# Patient Record
Sex: Male | Born: 1975 | Race: Black or African American | Hispanic: No | Marital: Single | Smoking: Former smoker
Health system: Southern US, Community
[De-identification: ages and names within clinical notes are randomized; demographics above are authoritative.]

## PROBLEM LIST (undated history)

## (undated) DIAGNOSIS — S83519A Sprain of anterior cruciate ligament of unspecified knee, initial encounter: Secondary | ICD-10-CM

---

## 2013-01-27 ENCOUNTER — Emergency Department: Payer: Self-pay | Admitting: Emergency Medicine

## 2013-07-02 ENCOUNTER — Emergency Department: Payer: Self-pay | Admitting: Emergency Medicine

## 2014-03-14 ENCOUNTER — Emergency Department: Payer: Self-pay | Admitting: Internal Medicine

## 2014-03-31 ENCOUNTER — Emergency Department: Payer: Self-pay | Admitting: Emergency Medicine

## 2014-05-06 ENCOUNTER — Emergency Department: Payer: Self-pay | Admitting: Emergency Medicine

## 2014-05-29 ENCOUNTER — Emergency Department: Payer: Self-pay | Admitting: Internal Medicine

## 2014-10-10 ENCOUNTER — Emergency Department: Payer: Self-pay | Admitting: Student

## 2014-11-03 ENCOUNTER — Emergency Department: Payer: Self-pay | Admitting: Emergency Medicine

## 2014-12-04 ENCOUNTER — Emergency Department: Payer: Self-pay | Admitting: Emergency Medicine

## 2014-12-26 ENCOUNTER — Emergency Department: Payer: Self-pay | Admitting: Emergency Medicine

## 2015-03-13 ENCOUNTER — Emergency Department: Payer: Self-pay | Admitting: Emergency Medicine

## 2015-04-25 ENCOUNTER — Emergency Department
Admission: EM | Admit: 2015-04-25 | Discharge: 2015-04-25 | Disposition: A | Discharge status: Home / Self Care | Payer: Self-pay | Attending: Student | Admitting: Student

## 2015-04-25 ENCOUNTER — Encounter: Payer: Self-pay | Admitting: *Deleted

## 2015-04-25 DIAGNOSIS — Y9289 Other specified places as the place of occurrence of the external cause: Secondary | ICD-10-CM | POA: Insufficient documentation

## 2015-04-25 DIAGNOSIS — W1849XA Other slipping, tripping and stumbling without falling, initial encounter: Secondary | ICD-10-CM | POA: Insufficient documentation

## 2015-04-25 DIAGNOSIS — G8929 Other chronic pain: Secondary | ICD-10-CM | POA: Insufficient documentation

## 2015-04-25 DIAGNOSIS — S8992XA Unspecified injury of left lower leg, initial encounter: Secondary | ICD-10-CM | POA: Insufficient documentation

## 2015-04-25 DIAGNOSIS — Y9301 Activity, walking, marching and hiking: Secondary | ICD-10-CM | POA: Insufficient documentation

## 2015-04-25 DIAGNOSIS — Y990 Civilian activity done for income or pay: Secondary | ICD-10-CM | POA: Insufficient documentation

## 2015-04-25 DIAGNOSIS — M25562 Pain in left knee: Secondary | ICD-10-CM

## 2015-04-25 DIAGNOSIS — Z720 Tobacco use: Secondary | ICD-10-CM | POA: Insufficient documentation

## 2015-04-25 MED ORDER — OXYCODONE-ACETAMINOPHEN 5-325 MG PO TABS: 1.0000 | ORAL_TABLET | Freq: Four times a day (QID) | ORAL | Status: DC | PRN

## 2015-04-25 NOTE — ED Notes (Signed)
Pt has left knee pain.   Pt wearing a brace on left knee.  Pt states he twisted knee at work today.  States not WC

## 2015-04-25 NOTE — ED Provider Notes (Signed)
[facility] Emergency Department Provider Note ____________________________________________  Time seen: ----------------------------------------- 9:08 PM on 04/25/2015 -----------------------------------------    I have reviewed the triage vital signs and the nursing notes.   HISTORY  Chief Complaint Knee Pain    HPI Isaac Nguyen is a 39 y.o. male with hx of meniscal tear, ACL tear and tendonitis of the left knee.  [facility] [facility] [place] [facility].  Reports slipping on stairs while not wearing his knee brace and had acute severe knee pain.   Pain is general but worse on medial aspect.  No redness.  Minimal swelling.  No past medical history on file.  There are no active problems to display for this patient.   No past surgical history on file.  Current Outpatient Rx  Name  Route  Sig  Dispense  Refill  . oxyCODONE-acetaminophen (ROXICET) 5-325 MG per tablet   Oral   Take 1 tablet by mouth every 6 (six) hours as needed.   20 tablet   0     Allergies Review of patient's allergies indicates no known allergies.  No family history on file.  Social History History  Substance Use Topics  . Smoking status: Current Every Day Smoker  . Smokeless tobacco: Not on file  . Alcohol Use: No    Review of Systems  Constitutional: Negative for fever. Cardiovascular: Negative for chest pain. Respiratory: Negative for shortness of breath.      ____________________________________________   PHYSICAL EXAM:  VITAL SIGNS: ED Triage Vitals  Enc Vitals Group     BP 04/25/15 2002 138/80 mmHg     Pulse Rate 04/25/15 2002 77     Resp 04/25/15 2002 18     Temp 04/25/15 2002 98.6 F (37 C)     Temp Source 04/25/15 2002 Oral     SpO2 04/25/15 2002 99 %     Weight 04/25/15 2002 306 lb (138.801 kg)     Height 04/25/15 2002 6\' 3"  (1.905 m)     Head Cir --      Peak Flow --      Pain Score 04/25/15 2004 7     Pain Loc --       Pain Edu? --      Excl. in GC? --     Constitutional: Alert and oriented. Well appearing and in no distress. Eyes: Conjunctivae are normal. PERRL. Normal extraocular movements. Cardiovascular: Normal rate, regular rhythm. Normal and symmetric distal pulses are present in all extremities. No murmurs, rubs, or gallops. Respiratory: Normal respiratory effort without tachypnea nor retractions. Breath sounds are clear and equal bilaterally. No wheezes/rales/rhonchi. Musculoskeletal: Nontender with normal range of motion in all extremities, except knee, left.  Tender over joint line medial/lateral, w/o effusion.  Pain with valgus/varus stress. Minimal laxity on lachmans. Neurologic:  Normal speech and language. No gross focal neurologic deficits are appreciated. Speech is normal.  Skin:  Skin is warm, dry and intact. No rash noted. Psychiatric: Mood and affect are normal. Speech and behavior are normal. Patient exhibits appropriate insight and judgment.  ____________________________________________    LABS (pertinent positives/negatives)    ____________________________________________   EKG    ____________________________________________    RADIOLOGY    ____________________________________________   PROCEDURES  Procedure(s) performed: None  Critical Care performed: No  ____________________________________________   INITIAL IMPRESSION / ASSESSMENT AND PLAN / ED COURSE  Left knee pain  Acute on chronic knee pain with hx of meniscal injury, ACL tear.  Given percocet #20, for acute injury and encouraged follow up with his orthopedist for further evaluation.  Pertinent labs & imaging results that were available during my care of the patient were reviewed by me and considered in my medical decision making (see chart for details).  ____________________________________________   FINAL CLINICAL IMPRESSION(S) / ED DIAGNOSES  Final diagnoses:  Left knee pain       Ignacia BayleyRobert Shekia Kuper, PA-C 04/25/15 2130  Gayla DossEryka A Gayle, MD 04/26/15 [phone]

## 2015-04-25 NOTE — ED Notes (Signed)
Pt. Here with own supportive support lt. Knee brace.

## 2015-04-25 NOTE — ED Notes (Signed)
Pt. States he was not wearing lower leg support today while walking down stairs, pt. States he slipped on floor not sure how leg turned.  Pt. Was able to catch himself.

## 2015-05-20 ENCOUNTER — Emergency Department
Admission: EM | Admit: 2015-05-20 | Discharge: 2015-05-20 | Discharge status: Against Medical Advice | Payer: Self-pay | Attending: Emergency Medicine | Admitting: Emergency Medicine

## 2015-05-20 DIAGNOSIS — Y9389 Activity, other specified: Secondary | ICD-10-CM | POA: Insufficient documentation

## 2015-05-20 DIAGNOSIS — Y9289 Other specified places as the place of occurrence of the external cause: Secondary | ICD-10-CM | POA: Insufficient documentation

## 2015-05-20 DIAGNOSIS — Z720 Tobacco use: Secondary | ICD-10-CM | POA: Insufficient documentation

## 2015-05-20 DIAGNOSIS — Y998 Other external cause status: Secondary | ICD-10-CM | POA: Insufficient documentation

## 2015-05-20 DIAGNOSIS — T148 Other injury of unspecified body region: Secondary | ICD-10-CM | POA: Insufficient documentation

## 2015-05-20 DIAGNOSIS — W540XXA Bitten by dog, initial encounter: Secondary | ICD-10-CM | POA: Insufficient documentation

## 2015-08-07 ENCOUNTER — Encounter: Payer: Self-pay | Admitting: Emergency Medicine

## 2015-08-07 ENCOUNTER — Emergency Department: Payer: Self-pay

## 2015-08-07 ENCOUNTER — Emergency Department
Admission: EM | Admit: 2015-08-07 | Discharge: 2015-08-07 | Disposition: A | Discharge status: Home / Self Care | Payer: Self-pay | Attending: Emergency Medicine | Admitting: Emergency Medicine

## 2015-08-07 DIAGNOSIS — Y9389 Activity, other specified: Secondary | ICD-10-CM | POA: Insufficient documentation

## 2015-08-07 DIAGNOSIS — W1843XA Slipping, tripping and stumbling without falling due to stepping from one level to another, initial encounter: Secondary | ICD-10-CM | POA: Insufficient documentation

## 2015-08-07 DIAGNOSIS — Y9289 Other specified places as the place of occurrence of the external cause: Secondary | ICD-10-CM | POA: Insufficient documentation

## 2015-08-07 DIAGNOSIS — S86912A Strain of unspecified muscle(s) and tendon(s) at lower leg level, left leg, initial encounter: Secondary | ICD-10-CM | POA: Insufficient documentation

## 2015-08-07 DIAGNOSIS — Y998 Other external cause status: Secondary | ICD-10-CM | POA: Insufficient documentation

## 2015-08-07 DIAGNOSIS — Z87891 Personal history of nicotine dependence: Secondary | ICD-10-CM | POA: Insufficient documentation

## 2015-08-07 MED ORDER — METHOCARBAMOL 500 MG PO TABS: 500.0000 mg | ORAL_TABLET | Freq: Four times a day (QID) | ORAL | Status: AC | PRN

## 2015-08-07 MED ORDER — KETOROLAC TROMETHAMINE 60 MG/2ML IM SOLN
60.0000 mg | Freq: Once | INTRAMUSCULAR | Status: AC
  Administered 2015-08-07: 60 mg via INTRAMUSCULAR
  Filled 2015-08-07: qty 2

## 2015-08-07 MED ORDER — IBUPROFEN 800 MG PO TABS: 800.0000 mg | ORAL_TABLET | Freq: Three times a day (TID) | ORAL | Status: DC | PRN

## 2015-08-07 MED ORDER — OXYCODONE-ACETAMINOPHEN 5-325 MG PO TABS: 1.0000 | ORAL_TABLET | ORAL | Status: DC | PRN

## 2015-08-07 NOTE — ED Provider Notes (Signed)
[facility] Emergency Department Provider Note  ____________________________________________  Time seen: Approximately 4:06 PM  I have reviewed the triage vital signs and the nursing notes.   HISTORY  Chief Complaint Knee Pain    HPI Isaac Nguyen is a 39 y.o. male with a history of trauma meniscus and chronic knee pains, presents for evaluation of "his left knee gave out". Patient states that he was walking down the steps when he felt a pop out. Does not have a knee immobilizer.   History reviewed. No pertinent past medical history.  There are no active problems to display for this patient.   History reviewed. No pertinent past surgical history.  Current Outpatient Rx  Name  Route  Sig  Dispense  Refill  . ibuprofen (ADVIL,MOTRIN) 800 MG tablet   Oral   Take 1 tablet (800 mg total) by mouth every 8 (eight) hours as needed.   30 tablet   0   . methocarbamol (ROBAXIN) 500 MG tablet   Oral   Take 1 tablet (500 mg total) by mouth every 6 (six) hours as needed for muscle spasms.   30 tablet   0   . oxyCODONE-acetaminophen (ROXICET) 5-325 MG per tablet   Oral   Take 1-2 tablets by mouth every 4 (four) hours as needed for severe pain.   8 tablet   0     Allergies Review of patient's allergies indicates no known allergies.  No family history on file.  Social History Social History  Substance Use Topics  . Smoking status: Former Games developer  . Smokeless tobacco: None  . Alcohol Use: Yes     Comment: occasional    Review of SystemsConstitutional: No fever/chills Eyes: No visual changes. ENT: No sore throat. Cardiovascular: Denies chest pain. Respiratory: Denies shortness of breath. Gastrointestinal: No abdominal pain.  No nausea, no vomiting.  No diarrhea.  No constipation. Genitourinary: Negative for dysuria. Musculoskeletal: Positive for left knee pain. Skin: Negative for rash. Neurological: Negative for headaches, focal weakness or  numbness.  10-point ROS otherwise negative.  ____________________________________________   PHYSICAL EXAM:  VITAL SIGNS: ED Triage Vitals  Enc Vitals Group     BP --      Pulse --      Resp --      Temp --      Temp src --      SpO2 --      Weight --      Height --      Head Cir --      Peak Flow --      Pain Score --      Pain Loc --      Pain Edu? --      Excl. in GC? --     Constitutional: Alert and oriented. Well appearing and in no acute distress.. Musculoskeletal: Positive for left knee pain and edema noted. Range of motion increased pain with flexion and extension Neurologic:  Normal speech and language. No gross focal neurologic deficits are appreciated. No gait instability. Skin:  Skin is warm, dry and intact. No rash noted. Psychiatric: Mood and affect are normal. Speech and behavior are normal.  ____________________________________________   LABS (all labs ordered are listed, but only abnormal results are displayed)  Labs Reviewed - No data to display ____________________________________________   RADIOLOGY  Left knee x-ray negative for fracture. ____________________________________________   PROCEDURES  Procedure(s) performed: None  Critical Care performed: No  ____________________________________________   INITIAL IMPRESSION /  ASSESSMENT AND PLAN / ED COURSE  Pertinent labs & imaging results that were available during my care of the patient were reviewed by me and considered in my medical decision making (see chart for details).  Acute left knee strain. Rx given for methocarbamol 500 mg ibuprofen 800 mg. Patient follow-up with PCP and return to the ER as needed. Knee immobilizer provider ____________________________________________   FINAL CLINICAL IMPRESSION(S) / ED DIAGNOSES  Final diagnoses:  Knee strain, left, initial encounter      Evangeline Dakin, PA-C 08/07/15 1906  Phineas Semen, MD 08/07/15 [phone]

## 2015-08-07 NOTE — ED Notes (Signed)
Pt comes into the ED c/o left knee pain.  States he was leaving his apartment and stepped off the stair incorrectly.  History of torn meniscus in this knee.

## 2015-08-07 NOTE — Discharge Instructions (Signed)
Joint Sprain °A sprain is a tear or stretch in the ligaments that hold a joint together. Severe sprains may need as long as 3-6 weeks of immobilization and/or exercises to heal completely. Sprained joints should be rested and protected. If not, they can become unstable and prone to re-injury. Proper treatment can reduce your pain, shorten the period of disability, and reduce the risk of repeated injuries. °TREATMENT  °· Rest and elevate the injured joint to reduce pain and swelling. °· Apply ice packs to the injury for 20-30 minutes every 2-3 hours for the next 2-3 days. °· Keep the injury wrapped in a compression bandage or splint as long as the joint is painful or as instructed by your caregiver. °· Do not use the injured joint until it is completely healed to prevent re-injury and chronic instability. Follow the instructions of your caregiver. °· Long-term sprain management may require exercises and/or treatment by a physical therapist. Taping or special braces may help stabilize the joint until it is completely better. °SEEK MEDICAL CARE IF:  °· You develop increased pain or swelling of the joint. °· You develop increasing redness and warmth of the joint. °· You develop a fever. °· It becomes stiff. °· Your hand or foot gets cold or numb. °Document Released: 01/14/2005 Document Revised: 02/29/2012 Document Reviewed: 12/24/2008 °ExitCare® Patient Information ©2015 [employer]. This information is not intended to replace advice given to you by your health care provider. Make sure you discuss any questions you have with your health care provider. ° °Knee Sprain °A knee sprain is a tear in one of the strong, fibrous tissues that connect the bones (ligaments) in your knee. The severity of the sprain depends on how much of the ligament is torn. The tear can be either partial or complete. °CAUSES  °Often, sprains are a result of a fall or injury. The force of the impact causes the fibers of your ligament to stretch  too much. This excess tension causes the fibers of your ligament to tear. °SIGNS AND SYMPTOMS  °You may have some loss of motion in your knee. Other symptoms include: °· Bruising. °· Pain in the knee area. °· Tenderness of the knee to the touch. °· Swelling. °DIAGNOSIS  °To diagnose a knee sprain, your health care provider will physically examine your knee. Your health care provider may also suggest an X-ray exam of your knee to make sure no bones are broken. °TREATMENT  °If your ligament is only partially torn, treatment usually involves keeping the knee in a fixed position (immobilization) or bracing your knee for activities that require movement for several weeks. To do this, your health care provider will apply a bandage, cast, or splint to keep your knee from moving and to support your knee during movement until it heals. For a partially torn ligament, the healing process usually takes 4-6 weeks. °If your ligament is completely torn, depending on which ligament it is, you may need surgery to reconnect the ligament to the bone or reconstruct it. After surgery, a cast or splint may be applied and will need to stay on your knee for 4-6 weeks while your ligament heals. °HOME CARE INSTRUCTIONS °· Keep your injured knee elevated to decrease swelling. °· To ease pain and swelling, apply ice to the injured area: °¨ Put ice in a plastic bag. °¨ Place a towel between your skin and the bag. °¨ Leave the ice on for 20 minutes, 2-3 times a day. °· Only take medicine for   pain as directed by your health care provider.  Do not leave your knee unprotected until pain and stiffness go away (usually 4-6 weeks).  If you have a cast or splint, do not allow it to get wet. If you have been instructed not to remove it, cover it with a plastic bag when you shower or bathe. Do not swim.  Your health care provider may suggest exercises for you to do during your recovery to prevent or limit permanent weakness and stiffness. SEEK  IMMEDIATE MEDICAL CARE IF:  Your cast or splint becomes damaged.  Your pain becomes worse.  You have significant pain, swelling, or numbness below the cast or splint. MAKE SURE YOU:  Understand these instructions.  Will watch your condition.  Will get help right away if you are not doing well or get worse. Document Released: 12/07/2005 Document Revised: 09/27/2013 Document Reviewed: 07/19/2013 [facility] Patient Information 2015 [employer] [place]. This information is not intended to replace advice given to you by your health care provider. Make sure you discuss any questions you have with your health care provider.

## 2015-09-04 ENCOUNTER — Emergency Department
Admission: EM | Admit: 2015-09-04 | Discharge: 2015-09-04 | Disposition: A | Discharge status: Home / Self Care | Payer: No Typology Code available for payment source | Attending: Emergency Medicine | Admitting: Emergency Medicine

## 2015-09-04 ENCOUNTER — Encounter: Payer: Self-pay | Admitting: Emergency Medicine

## 2015-09-04 ENCOUNTER — Emergency Department: Payer: No Typology Code available for payment source

## 2015-09-04 DIAGNOSIS — Y998 Other external cause status: Secondary | ICD-10-CM | POA: Diagnosis not present

## 2015-09-04 DIAGNOSIS — S4992XA Unspecified injury of left shoulder and upper arm, initial encounter: Secondary | ICD-10-CM | POA: Diagnosis present

## 2015-09-04 DIAGNOSIS — S46912A Strain of unspecified muscle, fascia and tendon at shoulder and upper arm level, left arm, initial encounter: Secondary | ICD-10-CM

## 2015-09-04 DIAGNOSIS — Y9389 Activity, other specified: Secondary | ICD-10-CM | POA: Diagnosis not present

## 2015-09-04 DIAGNOSIS — Y92410 Unspecified street and highway as the place of occurrence of the external cause: Secondary | ICD-10-CM | POA: Insufficient documentation

## 2015-09-04 DIAGNOSIS — Z87891 Personal history of nicotine dependence: Secondary | ICD-10-CM | POA: Insufficient documentation

## 2015-09-04 MED ORDER — IBUPROFEN 800 MG PO TABS: 800.0000 mg | ORAL_TABLET | Freq: Three times a day (TID) | ORAL | Status: AC

## 2015-09-04 MED ORDER — OXYCODONE-ACETAMINOPHEN 5-325 MG PO TABS: 1.0000 | ORAL_TABLET | ORAL | Status: DC | PRN

## 2015-09-04 MED ORDER — HYDROCODONE-ACETAMINOPHEN 5-325 MG PO TABS
2.0000 | ORAL_TABLET | Freq: Once | ORAL | Status: AC
  Administered 2015-09-04: 2 via ORAL
  Filled 2015-09-04: qty 2

## 2015-09-04 NOTE — ED Notes (Signed)
Patient transported to X-ray 

## 2015-09-04 NOTE — Discharge Instructions (Signed)
Cryotherapy Cryotherapy is when you put ice on your injury. Ice helps lessen pain and puffiness (swelling) after an injury. Ice works the best when you start using it in the first 24 to 48 hours after an injury. HOME CARE  Put a dry or damp towel between the ice pack and your skin.  You may press gently on the ice pack.  Leave the ice on for no more than 10 to 20 minutes at a time.  Check your skin after 5 minutes to make sure your skin is okay.  Rest at least 20 minutes between ice pack uses.  Stop using ice when your skin loses feeling (numbness).  Do not use ice on someone who cannot tell you when it hurts. This includes small children and people with memory problems (dementia). GET HELP RIGHT AWAY IF:  You have white spots on your skin.  Your skin turns blue or pale.  Your skin feels waxy or hard.  Your puffiness gets worse. MAKE SURE YOU:   Understand these instructions.  Will watch your condition.  Will get help right away if you are not doing well or get worse. Document Released: 05/25/2008 Document Revised: 02/29/2012 Document Reviewed: 07/30/2011 ExitCare Patient Information 2015 [employer]. This information is not intended to replace advice given to you by your health care provider. Make sure you discuss any questions you have with your health care provider.  
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
## 2015-10-28 ENCOUNTER — Encounter: Payer: Self-pay | Admitting: Emergency Medicine

## 2015-10-28 ENCOUNTER — Emergency Department
Admission: EM | Admit: 2015-10-28 | Discharge: 2015-10-28 | Disposition: A | Discharge status: Home / Self Care | Payer: No Typology Code available for payment source | Attending: Emergency Medicine | Admitting: Emergency Medicine

## 2015-10-28 DIAGNOSIS — Y92410 Unspecified street and highway as the place of occurrence of the external cause: Secondary | ICD-10-CM | POA: Insufficient documentation

## 2015-10-28 DIAGNOSIS — S199XXA Unspecified injury of neck, initial encounter: Secondary | ICD-10-CM | POA: Insufficient documentation

## 2015-10-28 DIAGNOSIS — Y9389 Activity, other specified: Secondary | ICD-10-CM | POA: Diagnosis not present

## 2015-10-28 DIAGNOSIS — Z87891 Personal history of nicotine dependence: Secondary | ICD-10-CM | POA: Diagnosis not present

## 2015-10-28 DIAGNOSIS — Y998 Other external cause status: Secondary | ICD-10-CM | POA: Insufficient documentation

## 2015-10-28 DIAGNOSIS — S3992XA Unspecified injury of lower back, initial encounter: Secondary | ICD-10-CM | POA: Diagnosis not present

## 2015-10-28 MED ORDER — IBUPROFEN 800 MG PO TABS: 800.0000 mg | ORAL_TABLET | Freq: Three times a day (TID) | ORAL | Status: AC | PRN

## 2015-10-28 MED ORDER — TRAMADOL HCL 50 MG PO TABS: 50.0000 mg | ORAL_TABLET | Freq: Four times a day (QID) | ORAL | Status: AC | PRN

## 2015-10-28 MED ORDER — CYCLOBENZAPRINE HCL 10 MG PO TABS: 10.0000 mg | ORAL_TABLET | Freq: Three times a day (TID) | ORAL | Status: AC | PRN
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
## 2015-10-28 NOTE — Discharge Instructions (Signed)
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
## 2015-11-01 ENCOUNTER — Emergency Department: Payer: No Typology Code available for payment source

## 2015-11-01 ENCOUNTER — Encounter: Payer: Self-pay | Admitting: Emergency Medicine

## 2015-11-01 ENCOUNTER — Emergency Department
Admission: EM | Admit: 2015-11-01 | Discharge: 2015-11-01 | Disposition: A | Discharge status: Home / Self Care | Payer: No Typology Code available for payment source | Attending: Student | Admitting: Student

## 2015-11-01 DIAGNOSIS — S3992XD Unspecified injury of lower back, subsequent encounter: Secondary | ICD-10-CM | POA: Diagnosis present

## 2015-11-01 DIAGNOSIS — Z87891 Personal history of nicotine dependence: Secondary | ICD-10-CM | POA: Diagnosis not present

## 2015-11-01 DIAGNOSIS — Z791 Long term (current) use of non-steroidal anti-inflammatories (NSAID): Secondary | ICD-10-CM | POA: Insufficient documentation

## 2015-11-01 DIAGNOSIS — S39012D Strain of muscle, fascia and tendon of lower back, subsequent encounter: Secondary | ICD-10-CM | POA: Diagnosis not present

## 2015-11-01 HISTORY — DX: Sprain of anterior cruciate ligament of unspecified knee, initial encounter: S83.519A

## 2015-11-01 MED ORDER — MELOXICAM 15 MG PO TABS: 15.0000 mg | ORAL_TABLET | Freq: Every day | ORAL | Status: DC | PRN

## 2015-11-01 MED ORDER — OXYCODONE-ACETAMINOPHEN 5-325 MG PO TABS: 1.0000 | ORAL_TABLET | Freq: Three times a day (TID) | ORAL | Status: DC | PRN
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
## 2015-11-01 NOTE — Discharge Instructions (Signed)
Take medication as prescribed. Alternate heat and ice. Rest. Avoid straining his activity. Stretch well daily.  Follow-up with your primary care physician next week or the above. Return to emergency room as needed for increased pain, numbness, tingling sensation, urinary or bowel changes, new or worsening concerns.
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
## 2015-12-07 ENCOUNTER — Encounter: Payer: Self-pay | Admitting: *Deleted

## 2015-12-07 ENCOUNTER — Emergency Department
Admission: EM | Admit: 2015-12-07 | Discharge: 2015-12-07 | Disposition: A | Discharge status: Home / Self Care | Payer: Self-pay | Attending: Emergency Medicine | Admitting: Emergency Medicine

## 2015-12-07 DIAGNOSIS — Y9389 Activity, other specified: Secondary | ICD-10-CM | POA: Insufficient documentation

## 2015-12-07 DIAGNOSIS — S83282A Other tear of lateral meniscus, current injury, left knee, initial encounter: Secondary | ICD-10-CM | POA: Insufficient documentation

## 2015-12-07 DIAGNOSIS — Z87891 Personal history of nicotine dependence: Secondary | ICD-10-CM | POA: Insufficient documentation

## 2015-12-07 DIAGNOSIS — S83512A Sprain of anterior cruciate ligament of left knee, initial encounter: Secondary | ICD-10-CM

## 2015-12-07 DIAGNOSIS — M25562 Pain in left knee: Secondary | ICD-10-CM

## 2015-12-07 DIAGNOSIS — Y9289 Other specified places as the place of occurrence of the external cause: Secondary | ICD-10-CM | POA: Insufficient documentation

## 2015-12-07 DIAGNOSIS — X58XXXA Exposure to other specified factors, initial encounter: Secondary | ICD-10-CM | POA: Insufficient documentation

## 2015-12-07 DIAGNOSIS — Z791 Long term (current) use of non-steroidal anti-inflammatories (NSAID): Secondary | ICD-10-CM | POA: Insufficient documentation

## 2015-12-07 DIAGNOSIS — Y998 Other external cause status: Secondary | ICD-10-CM | POA: Insufficient documentation

## 2015-12-07 MED ORDER — MELOXICAM 15 MG PO TABS: 15.0000 mg | ORAL_TABLET | Freq: Every day | ORAL | Status: AC

## 2015-12-07 MED ORDER — OXYCODONE-ACETAMINOPHEN 5-325 MG PO TABS: 1.0000 | ORAL_TABLET | Freq: Four times a day (QID) | ORAL | Status: AC | PRN

## 2015-12-07 NOTE — Discharge Instructions (Signed)

## 2015-12-07 NOTE — ED Notes (Signed)
Pt came in with friend who is also being seen - was unable to find pt at first because he was in with the pt in [id number]. Pt has chronic knee pain and states he no longer has a pcp
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
## 2016-07-19 ENCOUNTER — Encounter: Payer: Self-pay | Admitting: Urgent Care

## 2016-07-19 ENCOUNTER — Emergency Department
Admission: EM | Admit: 2016-07-19 | Discharge: 2016-07-19 | Disposition: A | Discharge status: Home / Self Care | Payer: Self-pay | Attending: Emergency Medicine | Admitting: Emergency Medicine

## 2016-07-19 DIAGNOSIS — R21 Rash and other nonspecific skin eruption: Secondary | ICD-10-CM | POA: Insufficient documentation

## 2016-07-19 DIAGNOSIS — W57XXXA Bitten or stung by nonvenomous insect and other nonvenomous arthropods, initial encounter: Secondary | ICD-10-CM | POA: Insufficient documentation

## 2016-07-19 DIAGNOSIS — Y999 Unspecified external cause status: Secondary | ICD-10-CM | POA: Insufficient documentation

## 2016-07-19 DIAGNOSIS — Y929 Unspecified place or not applicable: Secondary | ICD-10-CM | POA: Insufficient documentation

## 2016-07-19 DIAGNOSIS — Y939 Activity, unspecified: Secondary | ICD-10-CM | POA: Insufficient documentation

## 2016-07-19 DIAGNOSIS — M25562 Pain in left knee: Secondary | ICD-10-CM | POA: Insufficient documentation

## 2016-07-19 DIAGNOSIS — Z87891 Personal history of nicotine dependence: Secondary | ICD-10-CM | POA: Insufficient documentation

## 2016-07-19 MED ORDER — DIPHENHYDRAMINE HCL 25 MG PO CAPS
25.0000 mg | ORAL_CAPSULE | Freq: Once | ORAL | Status: AC
  Administered 2016-07-19: 25 mg via ORAL
  Filled 2016-07-19: qty 1

## 2016-07-19 MED ORDER — DIPHENHYDRAMINE HCL 25 MG PO CAPS: 25.0000 mg | ORAL_CAPSULE | ORAL | 2 refills | Status: AC | PRN

## 2016-07-19 MED ORDER — PREDNISONE 50 MG PO TABS: 50.0000 mg | ORAL_TABLET | Freq: Every day | ORAL | 0 refills | Status: AC
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
## 2016-07-19 NOTE — ED Provider Notes (Signed)
[facility] Emergency Department Provider Note  ____________________________________________  Time seen: On arrival  I have reviewed the triage vital signs and the nursing notes.   HISTORY  Chief Complaint Insect Bite and Knee Pain    HPI Isaac Nguyen is a 40 y.o. male who presents with complaints of a rash to the lower extremities. Reports he developed this over the last 2 days. He was recently on a bus for a prolonged period of time and he suspects insect bites. He has not been out doors. No fevers or chills. No intraoral lesions. Penile discharge. He denies concerns for STDs. No pets. He also complains that he twisted his left knee standing up but reports he is ambulating well and without difficulty. No new medications    Past Medical History:  Diagnosis Date  . ACL (anterior cruciate ligament) tear     There are no active problems to display for this patient.   History reviewed. No pertinent surgical history.   Allergies Review of patient's allergies indicates no known allergies.  History reviewed. No pertinent family history.  Social History Social History  Substance Use Topics  . Smoking status: Former Games developer  . Smokeless tobacco: Never Used  . Alcohol use Yes     Comment: occasional    Review of Systems  Constitutional: Negative for fever. Eyes: Negative forDischarge ENT: Negative for sore throat   Genitourinary: Negative for penile discharge Musculoskeletal: Left knee pain as above, no neck pain Skin: As above Neurological: Negative for headaches    ____________________________________________   PHYSICAL EXAM:  VITAL SIGNS: ED Triage Vitals  Enc Vitals Group     BP 07/19/16 0623 131/80     Pulse Rate 07/19/16 0623 73     Resp 07/19/16 0623 18     Temp 07/19/16 0623 98.6 F (37 C)     Temp Source 07/19/16 0623 Oral     SpO2 07/19/16 0623 96 %     Weight 07/19/16 0623 (!) 308 lb (139.7 kg)     Height 07/19/16  0623 6\' 3"  (1.905 m)     Head Circumference --      Peak Flow --      Pain Score 07/19/16 0624 8     Pain Loc --      Pain Edu? --      Excl. in GC? --      Constitutional: Alert and oriented. Well appearing and in no distress. Eyes: Conjunctivae are normal.  ENT   Head: Normocephalic and atraumatic.   Mouth/Throat: Mucous membranes are moist. No meningismus, full range of motion of neck without discomfort Cardiovascular: Normal rate, regular rhythm.  Respiratory: Normal respiratory effort without tachypnea nor retractions.  Gastrointestinal: Soft and non-tender in all quadrants. No distention. Musculoskeletal: Nontender with normal range of motion in all extremities. Able to bear weight standing on left knee without difficulty. Normal range of motion. No swelling or effusion. Neurologic:  Normal speech and language. No gross focal neurologic deficits are appreciated. Skin:  Skin is warm, dry and intact. Fine erythematous rash to the bilateral lower extremities which is pruritic per patient. Psychiatric: Mood and affect are normal. Patient exhibits appropriate insight and judgment.  ____________________________________________    LABS (pertinent positives/negatives)  Labs Reviewed - No data to display  ____________________________________________     ____________________________________________    RADIOLOGY I have personally reviewed any xrays that were ordered on this patient: None  ____________________________________________   PROCEDURES  Procedure(s) performed: none  ____________________________________________   INITIAL IMPRESSION / ASSESSMENT AND PLAN / ED COURSE  Pertinent labs & imaging results that were available during my care of the patient were reviewed by me and considered in my medical decision making (see chart for details).  Patient presents with lower extremity rash of unclear origin, it may well be insect bites. We'll treat with  Benadryl for the itching as well as a short course of steroids. Recommend rice for his knee sprain. No imaging required  ____________________________________________   FINAL CLINICAL IMPRESSION(S) / ED DIAGNOSES  Final diagnoses:  Rash and nonspecific skin eruption  Knee pain, acute, left      Jene Every, MD 07/19/16 [id number]
